# Patient Record
Sex: Male | Born: 1990 | Race: White | Hispanic: No | Marital: Single | Smoking: Current every day smoker
Health system: Southern US, Community
[De-identification: ages and names within clinical notes are randomized; demographics above are authoritative.]

## PROBLEM LIST (undated history)

## (undated) DIAGNOSIS — F909 Attention-deficit hyperactivity disorder, unspecified type: Secondary | ICD-10-CM

## (undated) HISTORY — DX: Attention-deficit hyperactivity disorder, unspecified type: F90.9

---
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
## 2011-07-05 ENCOUNTER — Encounter (HOSPITAL_COMMUNITY): Payer: Self-pay | Admitting: Family Medicine

## 2011-07-05 ENCOUNTER — Emergency Department (HOSPITAL_COMMUNITY)
Admission: EM | Admit: 2011-07-05 | Discharge: 2011-07-05 | Disposition: A | Discharge status: Home / Self Care | Payer: 59 | Attending: Emergency Medicine | Admitting: Emergency Medicine

## 2011-07-05 DIAGNOSIS — R209 Unspecified disturbances of skin sensation: Secondary | ICD-10-CM | POA: Insufficient documentation

## 2011-07-05 DIAGNOSIS — I10 Essential (primary) hypertension: Secondary | ICD-10-CM | POA: Insufficient documentation

## 2011-07-05 DIAGNOSIS — R002 Palpitations: Secondary | ICD-10-CM | POA: Insufficient documentation

## 2011-07-05 DIAGNOSIS — T7840XA Allergy, unspecified, initial encounter: Secondary | ICD-10-CM

## 2011-07-05 NOTE — ED Notes (Signed)
C/o possible allergic reaction, onset 0800 felt really hot, skin "on fire", got flushed, skin turned red all over. Denies SOB, rash, hives, angioedema. Reports feeling 'jittery & shaky", heart racing.

## 2011-07-05 NOTE — ED Notes (Addendum)
States has been taking 2 different OTC meds for weight loss both contain niacin. Reports also drinks Red Bull/energy drinks. D. I. E. T: non stimulant wt management. Dexyfen: appetite suppression.

## 2011-07-05 NOTE — Discharge Instructions (Signed)
STOP TAKING THE ADDITIONAL NIACIN SUPPLEMENTS. CONTINUE BENADRYL FOR SYMPTOMATIC TREATMENT. RETURN HERE WITH ANY DIFFICULTY BREATHING OR SWALLOWING OR FOR NEW CONCERN.

## 2011-07-05 NOTE — ED Notes (Signed)
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 sts his body is burning all over. sts he has had a few reactions in the past but usually it is just hives. sts also he felt like his heart was racing and like he was going to pass out.

## 2011-07-23 NOTE — ED Provider Notes (Signed)
Medical screening examination/treatment/procedure(s) were performed by non-physician practitioner and as supervising physician I was immediately available for consultation/collaboration.   Benny Lennert, MD 07/23/11 [phone]

## 2011-07-23 NOTE — ED Provider Notes (Signed)
History     CSN: [id number]  Arrival date & time 07/05/11  1030   First MD Initiated Contact with Patient 07/05/11 1145      Chief Complaint  Patient presents with  . Allergic Reaction    (Consider location/radiation/quality/duration/timing/severity/associated sxs/prior treatment) Patient is a 21 y.o. male presenting with allergic reaction. The history is provided by the patient.  Allergic Reaction The primary symptoms are  palpitations. The primary symptoms do not include wheezing, shortness of breath, nausea, vomiting or urticaria. Primary symptoms comment: He had sudden onset of generalized burning sensation of skin with redness without raised rash or whelts.   The palpitations did not occur with shortness of breath.  Associated symptoms comments: He states that he had a racing heartrate and onset of this generalized burning, but denies SOB, or throat swelling. He is improving over time. He reports only new medications are diet supplements. No N, V or fever. Marland Kitchen    History reviewed. No pertinent past medical history.  History reviewed. No pertinent past surgical history.  History reviewed. No pertinent family history.  History  Substance Use Topics  . Smoking status: Never Smoker   . Smokeless tobacco: Not on file  . Alcohol Use:       Review of Systems  Constitutional: Negative for fever.  HENT: Negative for sore throat, facial swelling and trouble swallowing.   Respiratory: Negative for shortness of breath and wheezing.   Cardiovascular: Positive for palpitations. Negative for chest pain.  Gastrointestinal: Negative for nausea and vomiting.  Skin: Positive for color change.       See HPI.    Allergies  Review of patient's allergies indicates no known allergies.  Home Medications   Current Outpatient Rx  Name Route Sig Dispense Refill  . ADULT MULTIVITAMIN W/MINERALS CH Oral Take 1 tablet by mouth daily.      BP 146/80  Pulse 77  Temp 98.3 F (36.8 C)  (Oral)  Resp 17  SpO2 98%  Physical Exam  Constitutional: He appears well-developed and well-nourished.  HENT:  Head: Normocephalic.  Mouth/Throat: Oropharynx is clear and moist.  Neck: Normal range of motion. Neck supple.  Cardiovascular: Normal rate and regular rhythm.   No murmur heard. Pulmonary/Chest: Effort normal and breath sounds normal.  Abdominal: Soft. Bowel sounds are normal. There is no tenderness. There is no rebound and no guarding.  Musculoskeletal: Normal range of motion.  Neurological: He is alert. No cranial nerve deficit.  Skin: Skin is warm and dry. No rash noted.       He has mild generalized redness without rash.  Psychiatric: He has a normal mood and affect.    ED Course  Procedures (including critical care time)  Labs Reviewed - No data to display No results found.   1. Allergic reaction       MDM  He is better over time. Diet supplements reviewed and found to contain significant amounts of Niacin. He has been advised of the current symptoms as possibly being side effects of the amount he is taking and to stop. He reports he is ready for discharge.         Rodena Medin, PA-C 07/23/11 1021

## 2015-09-08 ENCOUNTER — Encounter: Payer: Self-pay | Admitting: Physician Assistant

## 2015-09-08 ENCOUNTER — Telehealth: Payer: Self-pay | Admitting: Physician Assistant

## 2015-09-08 ENCOUNTER — Ambulatory Visit (INDEPENDENT_AMBULATORY_CARE_PROVIDER_SITE_OTHER): Payer: Commercial Managed Care - PPO | Admitting: Physician Assistant

## 2015-09-08 VITALS — BP 133/82 | HR 90 | Temp 98.3°F | Ht 72.0 in | Wt 212.2 lb

## 2015-09-08 DIAGNOSIS — S0993XA Unspecified injury of face, initial encounter: Secondary | ICD-10-CM | POA: Insufficient documentation

## 2015-09-08 DIAGNOSIS — F902 Attention-deficit hyperactivity disorder, combined type: Secondary | ICD-10-CM

## 2015-09-08 MED ORDER — AMPHETAMINE-DEXTROAMPHET ER 20 MG PO CP24: 20.0000 mg | ORAL_CAPSULE | Freq: Every day | ORAL | 0 refills | Status: DC

## 2015-09-08 NOTE — Patient Instructions (Signed)
Attention Deficit Hyperactivity Disorder  Attention deficit hyperactivity disorder (ADHD) is a problem with behavior issues based on the way the brain functions (neurobehavioral disorder). It is a common reason for behavior and academic problems in school.  SYMPTOMS   There are 3 types of ADHD. The 3 types and some of the symptoms include:  · Inattentive.    Gets bored or distracted easily.    Loses or forgets things. Forgets to hand in homework.    Has trouble organizing or completing tasks.    Difficulty staying on task.    An inability to organize daily tasks and school work.    Leaving projects, chores, or homework unfinished.    Trouble paying attention or responding to details. Careless mistakes.    Difficulty following directions. Often seems like is not listening.    Dislikes activities that require sustained attention (like chores or homework).  · Hyperactive-impulsive.    Feels like it is impossible to sit still or stay in a seat. Fidgeting with hands and feet.    Trouble waiting turn.    Talking too much or out of turn. Interruptive.    Speaks or acts impulsively.    Aggressive, disruptive behavior.    Constantly busy or on the go; noisy.    Often leaves seat when they are expected to remain seated.    Often runs or climbs where it is not appropriate, or feels very restless.  · Combined.    Has symptoms of both of the above.  Often children with ADHD feel discouraged about themselves and with school. They often perform well below their abilities in school.  As children get older, the excess motor activities can calm down, but the problems with paying attention and staying organized persist. Most children do not outgrow ADHD but with good treatment can learn to cope with the symptoms.  DIAGNOSIS   When ADHD is suspected, the diagnosis should be made by professionals trained in ADHD. This professional will collect information about the individual suspected of having ADHD. Information must be collected from  various settings where the person lives, works, or attends school.    Diagnosis will include:  · Confirming symptoms began in childhood.  · Ruling out other reasons for the child's behavior.  · The health care providers will check with the child's school and check their medical records.  · They will talk to teachers and parents.  · Behavior rating scales for the child will be filled out by those dealing with the child on a daily basis.  A diagnosis is made only after all information has been considered.  TREATMENT   Treatment usually includes behavioral treatment, tutoring or extra support in school, and stimulant medicines. Because of the way a person's brain works with ADHD, these medicines decrease impulsivity and hyperactivity and increase attention. This is different than how they would work in a person who does not have ADHD. Other medicines used include antidepressants and certain blood pressure medicines.  Most experts agree that treatment for ADHD should address all aspects of the person's functioning. Along with medicines, treatment should include structured classroom management at school. Parents should reward good behavior, provide constant discipline, and set limits. Tutoring should be available for the child as needed.  ADHD is a lifelong condition. If untreated, the disorder can have long-term serious effects into adolescence and adulthood.  HOME CARE INSTRUCTIONS   · Often with ADHD there is a lot of frustration among family members dealing with the condition. Blame   and anger are also feelings that are common. In many cases, because the problem affects the family as a whole, the entire family may need help. A therapist can help the family find better ways to handle the disruptive behaviors of the person with ADHD and promote change. If the person with ADHD is young, most of the therapist's work is with the parents. Parents will learn techniques for coping with and improving their child's behavior.  Sometimes only the child with the ADHD needs counseling. Your health care providers can help you make these decisions.  · Children with ADHD may need help learning how to organize. Some helpful tips include:  ¨ Keep routines the same every day from wake-up time to bedtime. Schedule all activities, including homework and playtime. Keep the schedule in a place where the person with ADHD will often see it. Mark schedule changes as far in advance as possible.  ¨ Schedule outdoor and indoor recreation.  ¨ Have a place for everything and keep everything in its place. This includes clothing, backpacks, and school supplies.  ¨ Encourage writing down assignments and bringing home needed books. Work with your child's teachers for assistance in organizing school work.  · Offer your child a well-balanced diet. Breakfast that includes a balance of whole grains, protein, and fruits or vegetables is especially important for school performance. Children should avoid drinks with caffeine including:  ¨ Soft drinks.  ¨ Coffee.  ¨ Tea.  ¨ However, some older children (adolescents) may find these drinks helpful in improving their attention. Because it can also be common for adolescents with ADHD to become addicted to caffeine, talk with your health care provider about what is a safe amount of caffeine intake for your child.  · Children with ADHD need consistent rules that they can understand and follow. If rules are followed, give small rewards. Children with ADHD often receive, and expect, criticism. Look for good behavior and praise it. Set realistic goals. Give clear instructions. Look for activities that can foster success and self-esteem. Make time for pleasant activities with your child. Give lots of affection.  · Parents are their children's greatest advocates. Learn as much as possible about ADHD. This helps you become a stronger and better advocate for your child. It also helps you educate your child's teachers and instructors  if they feel inadequate in these areas. Parent support groups are often helpful. A national group with local chapters is called Children and Adults with Attention Deficit Hyperactivity Disorder (CHADD).  SEEK MEDICAL CARE IF:  · Your child has repeated muscle twitches, cough, or speech outbursts.  · Your child has sleep problems.  · Your child has a marked loss of appetite.  · Your child develops depression.  · Your child has new or worsening behavioral problems.  · Your child develops dizziness.  · Your child has a racing heart.  · Your child has stomach pains.  · Your child develops headaches.  SEEK IMMEDIATE MEDICAL CARE IF:  · Your child has been diagnosed with depression or anxiety and the symptoms seem to be getting worse.  · Your child has been depressed and suddenly appears to have increased energy or motivation.  · You are worried that your child is having a bad reaction to a medication he or she is taking for ADHD.     This information is not intended to replace advice given to you by your health care provider. Make sure you discuss any questions you have with your   health care provider.     Document Released: 12/16/2001 Document Revised: 12/31/2012 Document Reviewed: 09/02/2012  Elsevier Interactive Patient Education ©2016 [employer]vier Inc[employer]
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
## 2015-09-09 ENCOUNTER — Telehealth: Payer: Self-pay | Admitting: Physician Assistant

## 2015-09-09 NOTE — Telephone Encounter (Signed)
Attempted to call pt but no answer and no voicemail set up. Per Lennox Grumblesebbie Boles it has been approved and [facility] is aware.
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
## 2015-09-10 NOTE — Telephone Encounter (Signed)
done

## 2015-09-10 NOTE — Progress Notes (Signed)
ASRS 09/08/2015  1. How often do you have trouble wrapping up the final details of a project, once the challenging parts have been done? Often  2. How often do you have difficulty getting things done in order when you have to do a task that requires organization? Often  3. How often do you have problems remembering appointments or obligations? Sometimes  4. When you have a task that requires a lot of thought, how often do you avoid or delay getting started? Very Often  5. How often do you fidget or squirm with your hands or feet when you have to sit down for a long time? Very Often  6. How often do you feel overly active and compelled to do things, like you were driven by a motor? Often  7. How often do you make careless mistakes when you have to work on a boring or difficult project? Often  8. How often do you have difficulty keeping your attention when you are doing boring or repetitive work? Very Often  9. How often do you have difficulty concentrating on what people say to you, even when they are speaking to you directly? Often  10. How often do you misplace or have difficulty finding things at home or at work? Sometimes  11. How often are you distracted by activity or noise around you? Often  13. How often do you feel restless or fidgety? Often  14. How often do you have difficulty unwinding and relaxing when you have time to yourself? Sometimes  15. How often do you find yourself talking too much when you are in social situations? Sometimes  16. When you are in a conversation, how often do you find yourself finishing the sentences of the people you are talking to, before they can finish them themselves? Very Often  How old were you when these problems first began to occur? 5

## 2015-10-06 ENCOUNTER — Encounter: Payer: Self-pay | Admitting: Physician Assistant

## 2015-10-06 ENCOUNTER — Ambulatory Visit (INDEPENDENT_AMBULATORY_CARE_PROVIDER_SITE_OTHER): Payer: Commercial Managed Care - PPO | Admitting: Physician Assistant

## 2015-10-06 VITALS — BP 130/89 | HR 83 | Temp 97.4°F | Ht 72.0 in | Wt 205.6 lb

## 2015-10-06 DIAGNOSIS — F902 Attention-deficit hyperactivity disorder, combined type: Secondary | ICD-10-CM | POA: Diagnosis not present

## 2015-10-06 DIAGNOSIS — F17200 Nicotine dependence, unspecified, uncomplicated: Secondary | ICD-10-CM

## 2015-10-06 MED ORDER — AMPHETAMINE-DEXTROAMPHET ER 30 MG PO CP24: 30.0000 mg | ORAL_CAPSULE | ORAL | 0 refills | Status: DC
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
## 2015-10-06 NOTE — Patient Instructions (Signed)
Smokeless Tobacco Use Smokeless tobacco is a loose, fine, or stringy tobacco. The tobacco is not smoked like a cigarette, but it is chewed or held in the lips or cheeks. It resembles tea and comes from the leaves of the tobacco plant. Smokeless tobacco is usually flavored, sweetened, or processed in some way. Although smokeless tobacco is not smoked into the lungs, its chemicals are absorbed through the membranes in the mouth and into the bloodstream. Its chemicals are also swallowed in saliva. The chemicals (nicotine and other toxins) are known to cause cancer. Smokeless tobacco contains up to 28 differentcarcinogens. CAUSES Nicotine is addictive. Smokeless tobacco contains nicotine, which is a stimulant. This stimulant can give you a "buzz" or altered state. People can become addicted to the feeling it delivers.  SYMPTOMS Smokeless tobacco can cause health problems, including:  Bad breath.  Yellow-brown teeth.  Mouth sores.  Cracking and bleeding lips.  Gum disease, gum recession, and bone loss around the teeth.  Tooth decay.  Increased or irregular heart rate.  High blood pressure, heart disease, and stroke.  Cancer of the mouth, lips, tongue, pancreas, voice box (larynx), esophagus, colon, and bladder.  Precancerous lesion of the soft tissues of the mouth (leukoplakia).  Loss of your sense of taste. TREATMENT Talk with your caregiver about ways you can quit. Quitting tobacco is a good decision for your health. Nicotine is addictive, but several options are available to help you quit including:  Nicotine replacement therapy (gum or patch).  Support and cessation programs. The following tips can help you quit:  Write down the reasons you would like to quit and look at them often.  Set a date during a low stress time to stop or cut back.  Ask family and friends for their support.  Remove all tobacco products from your home and work.  Replace the chewing tobacco with  things like beef jerky, sunflower seeds, or shredded coconut.  Avoid situations that may make you want to chew tobacco.  Exercise and eat a healthy diet.  When you crave tobacco, distract yourself with drinking water, sugarless chewing gum, sugarless hard candy, exercising, or deep breathing. HOME CARE INSTRUCTIONS  See your dentist for regular oral health exams every 6 months.  Follow up with your caregiver as recommended. SEEK MEDICAL CARE OR DENTAL CARE IF:  You have bleeding or cracking lips, gums, or cheeks.  You have mouth sores, discolorations, or pain.  You have tooth pain.  You develop persistent irritation, burning, or sores in the mouth.  You have pain, tenderness, or numbness in the mouth.  You develop a lump, bumpy patch, or hardened skin inside the mouth.  The color changes inside your mouth (gray, white, or red spots).  You have difficulty chewing, swallowing, or speaking.   This information is not intended to replace advice given to you by your health care provider. Make sure you discuss any questions you have with your health care provider.   Document Released: 05/30/2010 Document Revised: 03/20/2011 Document Reviewed: 05/30/2010 Elsevier Interactive Patient Education 2016 [employer]  

## 2016-01-06 ENCOUNTER — Other Ambulatory Visit: Payer: Self-pay | Admitting: Physician Assistant

## 2016-01-06 DIAGNOSIS — F902 Attention-deficit hyperactivity disorder, combined type: Secondary | ICD-10-CM

## 2016-01-06 MED ORDER — AMPHETAMINE-DEXTROAMPHET ER 30 MG PO CP24: 30.0000 mg | ORAL_CAPSULE | ORAL | 0 refills | Status: DC

## 2016-01-06 NOTE — Telephone Encounter (Signed)
Refilling one time, please keep appointment

## 2016-01-06 NOTE — Telephone Encounter (Signed)
Patient aware and verbalizes understanding. Rx placed up front.  

## 2016-01-14 ENCOUNTER — Ambulatory Visit: Payer: Commercial Managed Care - PPO | Admitting: Physician Assistant

## 2016-01-17 ENCOUNTER — Encounter: Payer: Self-pay | Admitting: Physician Assistant

## 2016-01-21 ENCOUNTER — Ambulatory Visit: Payer: Commercial Managed Care - PPO | Admitting: Physician Assistant

## 2016-01-24 ENCOUNTER — Encounter: Payer: Self-pay | Admitting: Physician Assistant

## 2016-02-03 ENCOUNTER — Encounter: Payer: Self-pay | Admitting: Physician Assistant

## 2016-02-03 ENCOUNTER — Ambulatory Visit (INDEPENDENT_AMBULATORY_CARE_PROVIDER_SITE_OTHER): Payer: Commercial Managed Care - PPO | Admitting: Physician Assistant

## 2016-02-03 DIAGNOSIS — F902 Attention-deficit hyperactivity disorder, combined type: Secondary | ICD-10-CM | POA: Diagnosis not present

## 2016-02-03 MED ORDER — AMPHETAMINE-DEXTROAMPHET ER 30 MG PO CP24: 30.0000 mg | ORAL_CAPSULE | ORAL | 0 refills | Status: DC

## 2016-02-03 NOTE — Progress Notes (Signed)
aj

## 2016-02-03 NOTE — Patient Instructions (Signed)
Attention Deficit Hyperactivity Disorder, Attention deficit hyperactivity disorder (ADHD) is a condition that can make it hard for a child to pay attention and concentrate or to control his or her behavior. The child may also have a lot of energy. ADHD is a disorder of the brain (neurodevelopmental disorder), and symptoms are typically first seen in early childhood. It is a common reason for behavioral and academic problems in school. There are three main types of ADHD:  Inattentive. With this type, children have difficulty paying attention.  Hyperactive-impulsive. With this type, children have a lot of energy and have difficulty controlling their behavior.  Combination. This type involves having symptoms of both of the other types. ADHD is a lifelong condition. If it is not treated, the disorder can affect a child's future academic achievement, employment, and relationships. What are the causes? The exact cause of this condition is not known. What increases the risk? This condition is more likely to develop in:  Children who have a first-degree relative, such as a parent or brother or sister, with the condition.  Children who had a low birth weight.  Children whose mothers had problems during pregnancy or used alcohol or tobacco during pregnancy.  Children who have had a brain infection or a head injury.  Children who have been exposed to lead. What are the signs or symptoms? Symptoms of this condition depend on the type of ADHD. Symptoms are listed here for each type: Inattentive  Problems with organization.  Difficulty staying focused.  Problems completing assignments at school.  Often making simple mistakes.  Problems sustaining mental effort.  Not listening to instructions.  Losing things often.  Forgetting things often.  Being easily distracted. Hyperactive-impulsive  Fidgeting often.  Difficulty sitting still in one's seat.  Talking a lot.  Talking out of  turn.  Interrupting others.  Difficulty relaxing or doing quiet activities.  High energy levels and constant movement.  Difficulty waiting.  Always "on the go." Combination  Having symptoms of both of the other types. Children with ADHD may feel frustrated with themselves and may find school to be particularly discouraging. They often perform below their abilities in school. As children get older, the excess movement can lessen, but the problems with paying attention and staying organized often continue. Most children do not outgrow ADHD, but with good treatment, they can learn to cope with the symptoms. How is this diagnosed? This condition is diagnosed based on a child's symptoms and academic history. The child's health care provider will do a complete assessment. As part of the assessment, the health care provider will ask the child questions and will ask the parents and teachers for their observations of the child. The health care provider looks for specific symptoms of ADHD. Diagnosis will include:  Ruling out other reasons for the child's behavior.  Reviewing behavior rating scales that have been filled out about the child by people who deal with the child on a daily basis. A diagnosis is made only after all information from multiple people has been considered. How is this treated? Treatment for this condition may include:  Behavior therapy.  Medicines to decrease impulsivity and hyperactivity and to increase attention. Behavior therapy is preferred for children younger than 6 years old. The combination of medicine and behavior therapy is most effective for children older than 6 years of age.  Tutoring or extra support at school.  Techniques for parents to use at home to help manage their child's symptoms and behavior. Follow these   instructions at home: Eating and drinking  Offer your child a well-balanced diet. Breakfast that includes a balance of whole grains, protein,  and fruits or vegetables is especially important for school performance.  If your child has trouble with hyperactivity, have your child avoid drinks that contain caffeine. These include:  Soft drinks.  Coffee.  Tea.  If your child is older and finds that caffeinated drinks help to improve his or her attention, talk with your child's health care provider about what amount of caffeine intake is a safe for your child. Lifestyle  Make sure your child gets a full night of sleep and regular daily exercise.  Help manage your child's behavior by following the techniques learned in therapy. These may include:  Looking for good behavior and rewarding it.  Making rules for behavior that your child can understand and follow.  Giving clear instructions.  Responding consistently to your child's challenging behaviors.  Setting realistic goals.  Looking for activities that can lead to success and self-esteem.  Making time for pleasant activities with your child.  Giving lots of affection.  Help your child learn to be organized. Some ways to do this include:  Keeping daily schedules the same. Have a regular wake-up time and bedtime for your child. Schedule all activities, including time for homework and time for play. Post the schedule in a place where your child will see it. Mark schedule changes in advance.  Having a regular place for your child to store items such as clothing, backpacks, and school supplies.  Encouraging your child to write down school assignments and to bring home needed books. Work with your child's teachers for assistance in organizing school work. General instructions  Learn as much as you can about ADHD. This will improve your ability to help your child and to make sure he or she gets the support needed. It will also help you educate your child's teachers and instructors if they do not feel that they have adequate knowledge or experience in these areas.  Work with  your child's teachers to make sure your child gets the support and extra help that is needed. This may include:  Tutoring.  Teacher cues to help your child remain on task.  Seating changes so your child is working at a desk that is free from distractions.  Give over-the-counter and prescription medicines only as told by your child's health care provider.  Keep all follow-up visits as told by your health care provider. This is important. Contact a health care provider if:  Your child has repeated muscle twitches (tics), coughs, or speech outbursts.  Your child has sleep problems.  Your child has a marked loss of appetite.  Your child develops depression.  Your child has new or worsening behavioral problems.  Your child has dizziness.  Your child has a racing heart.  Your child has stomach pains.  Your child develops headaches. Get help right away if:  Your child talks about or threatens suicide.  You are worried that your child is having a bad reaction to a medicine that he or she is taking for ADHD. This information is not intended to replace advice given to you by your health care provider. Make sure you discuss any questions you have with your health care provider. Document Released: 12/16/2001 Document Revised: 08/25/2015 Document Reviewed: 07/22/2015 Elsevier Interactive Patient Education  2017 Elsevier Inc.  
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
## 2016-05-03 ENCOUNTER — Ambulatory Visit (INDEPENDENT_AMBULATORY_CARE_PROVIDER_SITE_OTHER): Payer: Commercial Managed Care - PPO | Admitting: Physician Assistant

## 2016-05-03 ENCOUNTER — Encounter: Payer: Self-pay | Admitting: Physician Assistant

## 2016-05-03 DIAGNOSIS — F902 Attention-deficit hyperactivity disorder, combined type: Secondary | ICD-10-CM | POA: Diagnosis not present

## 2016-05-03 MED ORDER — AMPHETAMINE-DEXTROAMPHET ER 30 MG PO CP24: 30.0000 mg | ORAL_CAPSULE | ORAL | 0 refills | Status: AC
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
## 2016-05-03 NOTE — Patient Instructions (Signed)
In a few days you may receive a survey in the mail or online from [employer] regarding your visit with us today. Please take a moment to fill this out. Your feedback is very important to our whole office. It can help us better understand your needs as well as improve your experience and satisfaction. Thank you for taking your time to complete it. We care about you.  Yichen Gilardi, PA-C  

## 2020-02-04 ENCOUNTER — Other Ambulatory Visit: Payer: Self-pay

## 2020-02-04 ENCOUNTER — Emergency Department (HOSPITAL_COMMUNITY): Payer: Commercial Managed Care - PPO | Attending: Student

## 2020-02-04 ENCOUNTER — Emergency Department (HOSPITAL_COMMUNITY)
Admission: EM | Admit: 2020-02-04 | Discharge: 2020-02-04 | Disposition: A | Discharge status: Home / Self Care | Payer: Worker's Compensation | Attending: Emergency Medicine | Admitting: Emergency Medicine

## 2020-02-04 ENCOUNTER — Encounter (HOSPITAL_COMMUNITY): Payer: Self-pay

## 2020-02-04 DIAGNOSIS — Y990 Civilian activity done for income or pay: Secondary | ICD-10-CM | POA: Insufficient documentation

## 2020-02-04 DIAGNOSIS — F17200 Nicotine dependence, unspecified, uncomplicated: Secondary | ICD-10-CM | POA: Insufficient documentation

## 2020-02-04 DIAGNOSIS — W268XXA Contact with other sharp object(s), not elsewhere classified, initial encounter: Secondary | ICD-10-CM | POA: Insufficient documentation

## 2020-02-04 DIAGNOSIS — S61217A Laceration without foreign body of left little finger without damage to nail, initial encounter: Secondary | ICD-10-CM | POA: Insufficient documentation

## 2020-02-04 DIAGNOSIS — S6992XA Unspecified injury of left wrist, hand and finger(s), initial encounter: Secondary | ICD-10-CM | POA: Diagnosis present

## 2020-02-04 MED ORDER — LIDOCAINE HCL (PF) 1 % IJ SOLN
5.0000 mL | Freq: Once | INTRAMUSCULAR | Status: AC
  Administered 2020-02-04: 5 mL
  Filled 2020-02-04: qty 30

## 2020-02-04 MED ORDER — CEPHALEXIN 500 MG PO CAPS: 500.0000 mg | ORAL_CAPSULE | Freq: Two times a day (BID) | ORAL | 0 refills | Status: AC
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
## 2020-02-04 NOTE — ED Triage Notes (Signed)
Pt reports cutting his left little finger on metal at work. Pt states he went to [facility] before coming here and received a tetanus shot, but was sent here because they were unable to repair it there. Pt reports bleeding is controlled at this time.

## 2020-02-04 NOTE — Discharge Instructions (Addendum)
WOUND CARE Please return in 7 days to have your stitches/staples removed or sooner if you have concerns.  Keep area clean and dry for 24 hours. Do not remove bandage, if applied.  After 24 hours, remove bandage and wash wound gently with mild soap and warm water. Reapply a new bandage after cleaning wound, if directed.  Continue daily cleansing with soap and water until stitches/staples are removed.  Do not apply any ointments or creams to the wound while stitches/staples are in place, as this may cause delayed healing.  Notify the office if you experience any of the following signs of infection: Swelling, redness, pus drainage, streaking, fever >101.0 F  Notify the office if you experience excessive bleeding that does not stop after 15-20 minutes of constant, firm pressure.   Your blood pressure was slightly elevated today in the emergency department, I want you to get this rechecked with your primary care provider in the next week.

## 2020-02-16 ENCOUNTER — Ambulatory Visit: Payer: Commercial Managed Care - PPO | Admitting: Family Medicine

## 2020-02-16 ENCOUNTER — Other Ambulatory Visit: Payer: Self-pay

## 2020-02-20 MED ORDER — LIDOCAINE HCL 1 % IJ SOLN
INTRAMUSCULAR | Status: AC
  Filled 2020-02-20: qty 20

## 2021-06-27 IMAGING — CR DG FINGER LITTLE 2+V*L*
3 series · 3 of 3 positions shown · non-contrast
Comparison: None.

CLINICAL DATA: Recent laceration, initial encounter

EXAM:
LEFT LITTLE FINGER 2+V

[x finger pa left]
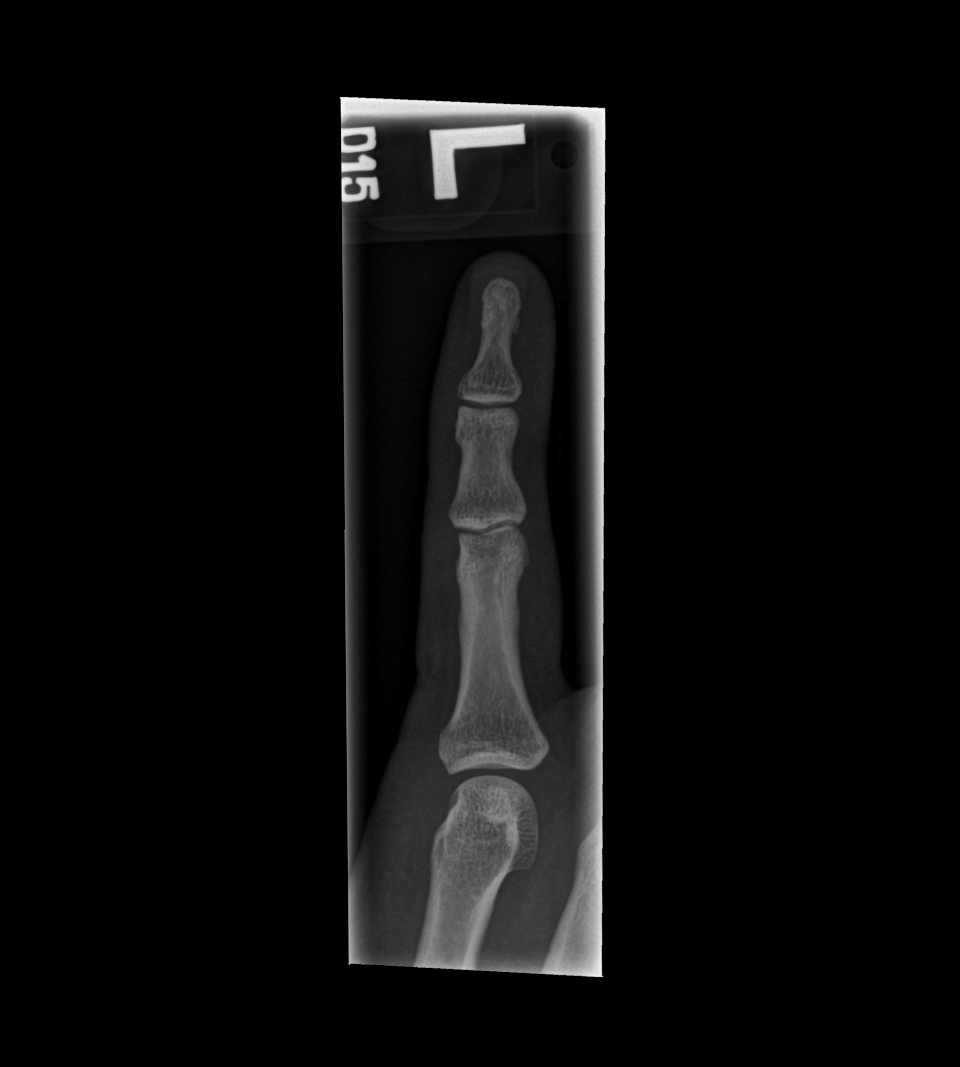

[x finger obl left]
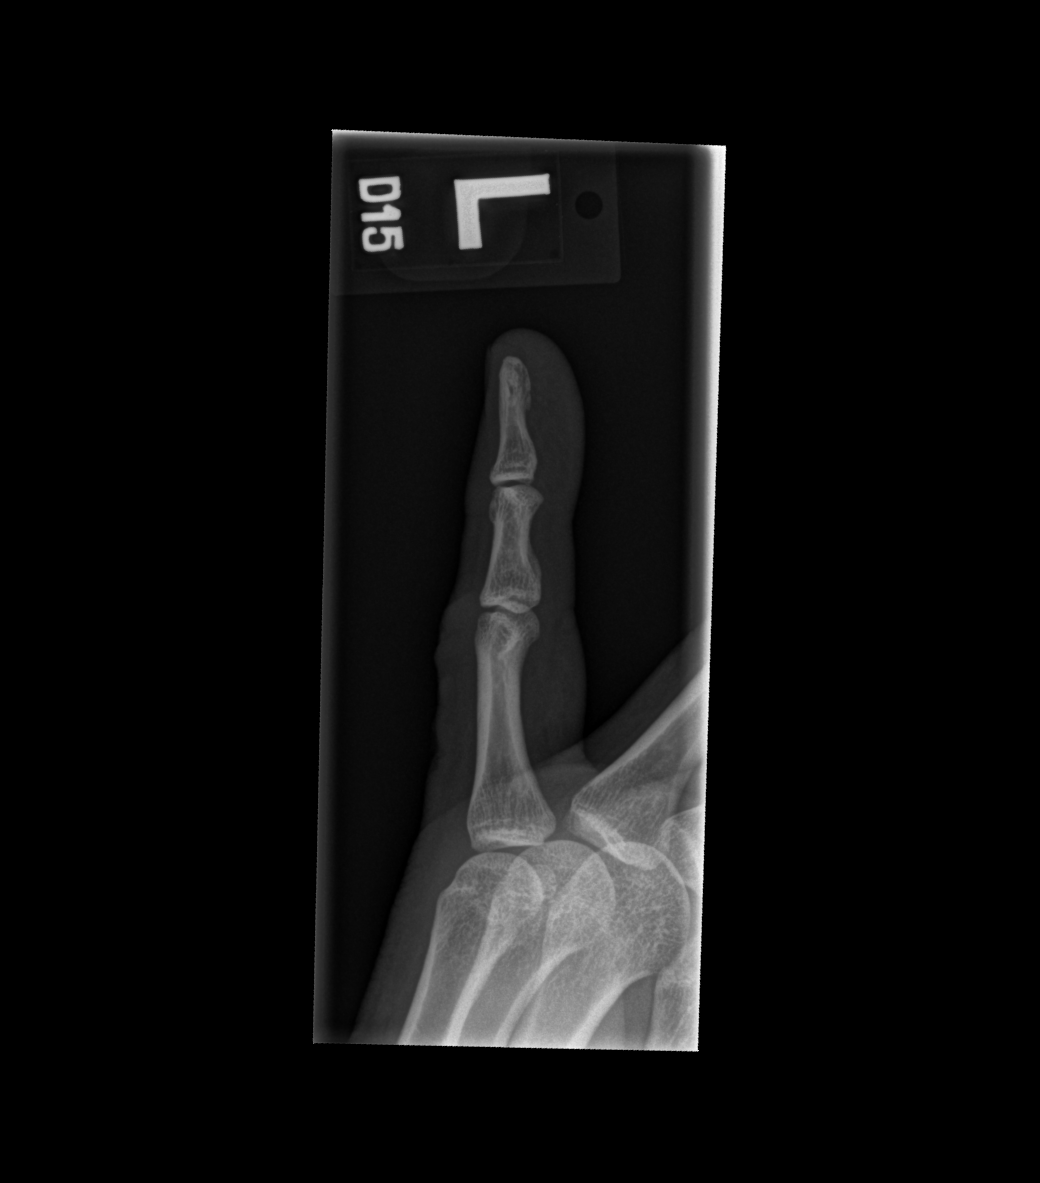

[x finger lat left]
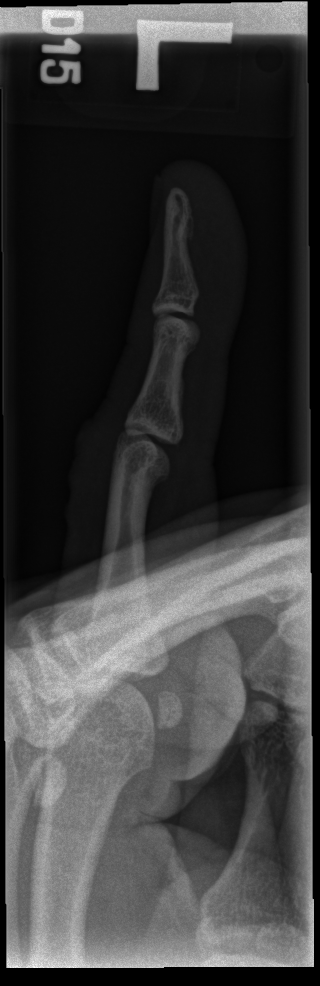

[3 of 3 positions shown; findings below may reference images not displayed]

FINDINGS: There is no evidence of fracture or dislocation. There is no
evidence of arthropathy or other focal bone abnormality. Soft
tissues are unremarkable.
IMPRESSION: No acute abnormality noted.
